# Patient Record
Sex: Male | Born: 2000 | Race: White | Hispanic: No | Marital: Single | Smoking: Never smoker
Health system: Southern US, Community
[De-identification: ages and names within clinical notes are randomized; demographics above are authoritative.]

---
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
## 2020-03-12 ENCOUNTER — Ambulatory Visit: Payer: BLUE CROSS/BLUE SHIELD
